# Patient Record
Sex: Male | Born: 1965 | Race: White | Hispanic: No | Marital: Single
Health system: Southern US, Community
[De-identification: ages and names within clinical notes are randomized; demographics above are authoritative.]

---

## 2008-10-26 ENCOUNTER — Emergency Department (HOSPITAL_COMMUNITY): Admission: EM | Admit: 2008-10-26 | Discharge: 2008-10-26 | Payer: Self-pay | Admitting: Emergency Medicine

## 2010-08-31 IMAGING — CR DG SHOULDER 2+V*L*
3 series · 3 of 3 positions shown · non-contrast
Comparison: None

CLINICAL DATA: Fell skating

LEFT SHOULDER - 2+ VIEW

[w shoulder ap internal left]
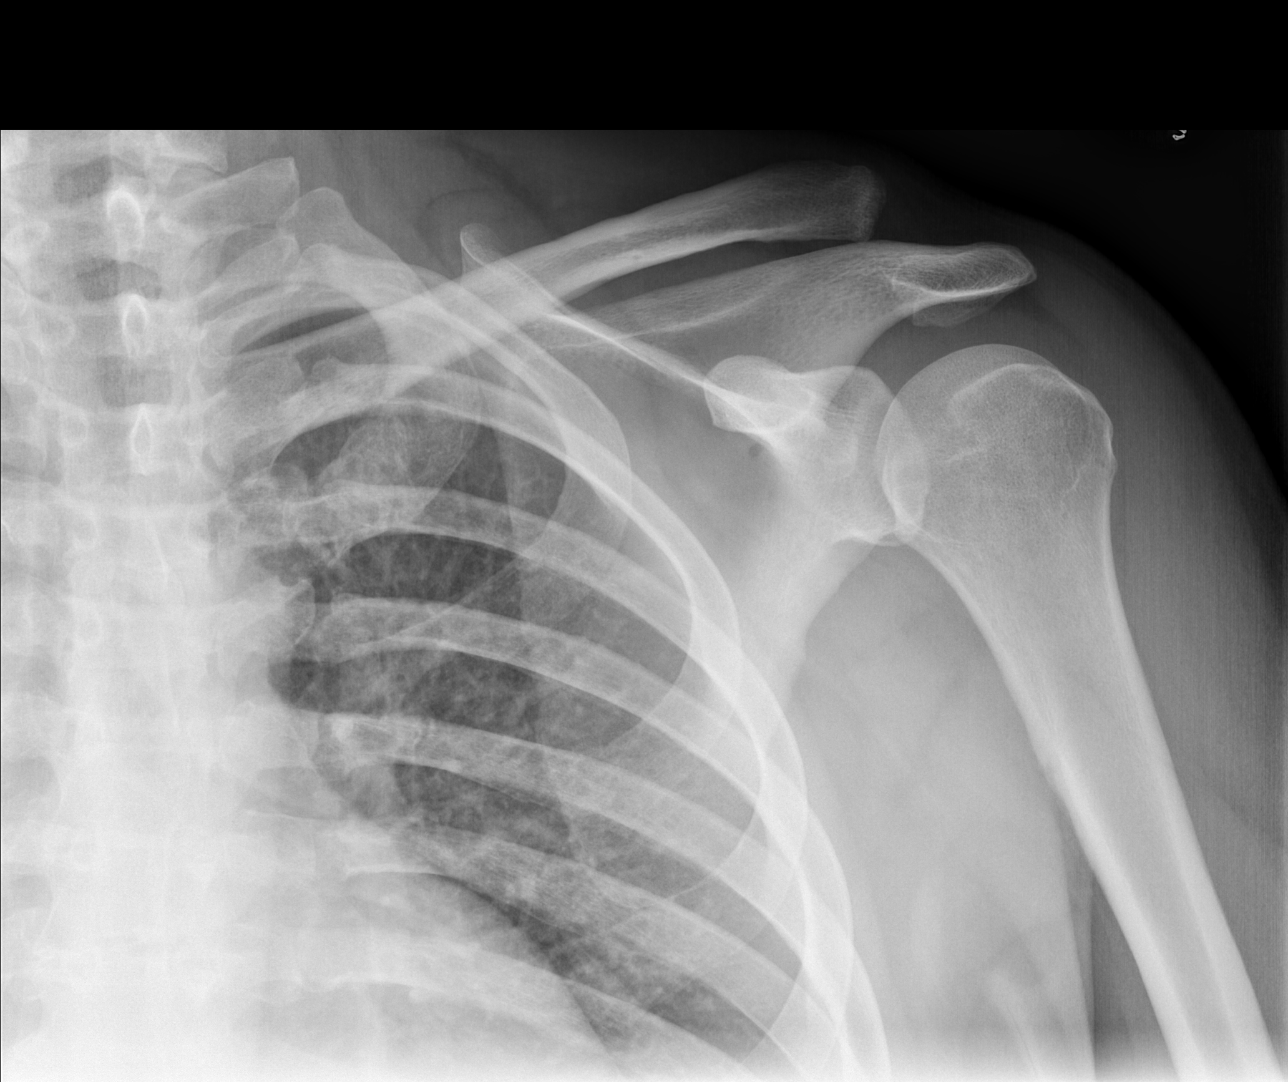

[w shoulder ap external left]
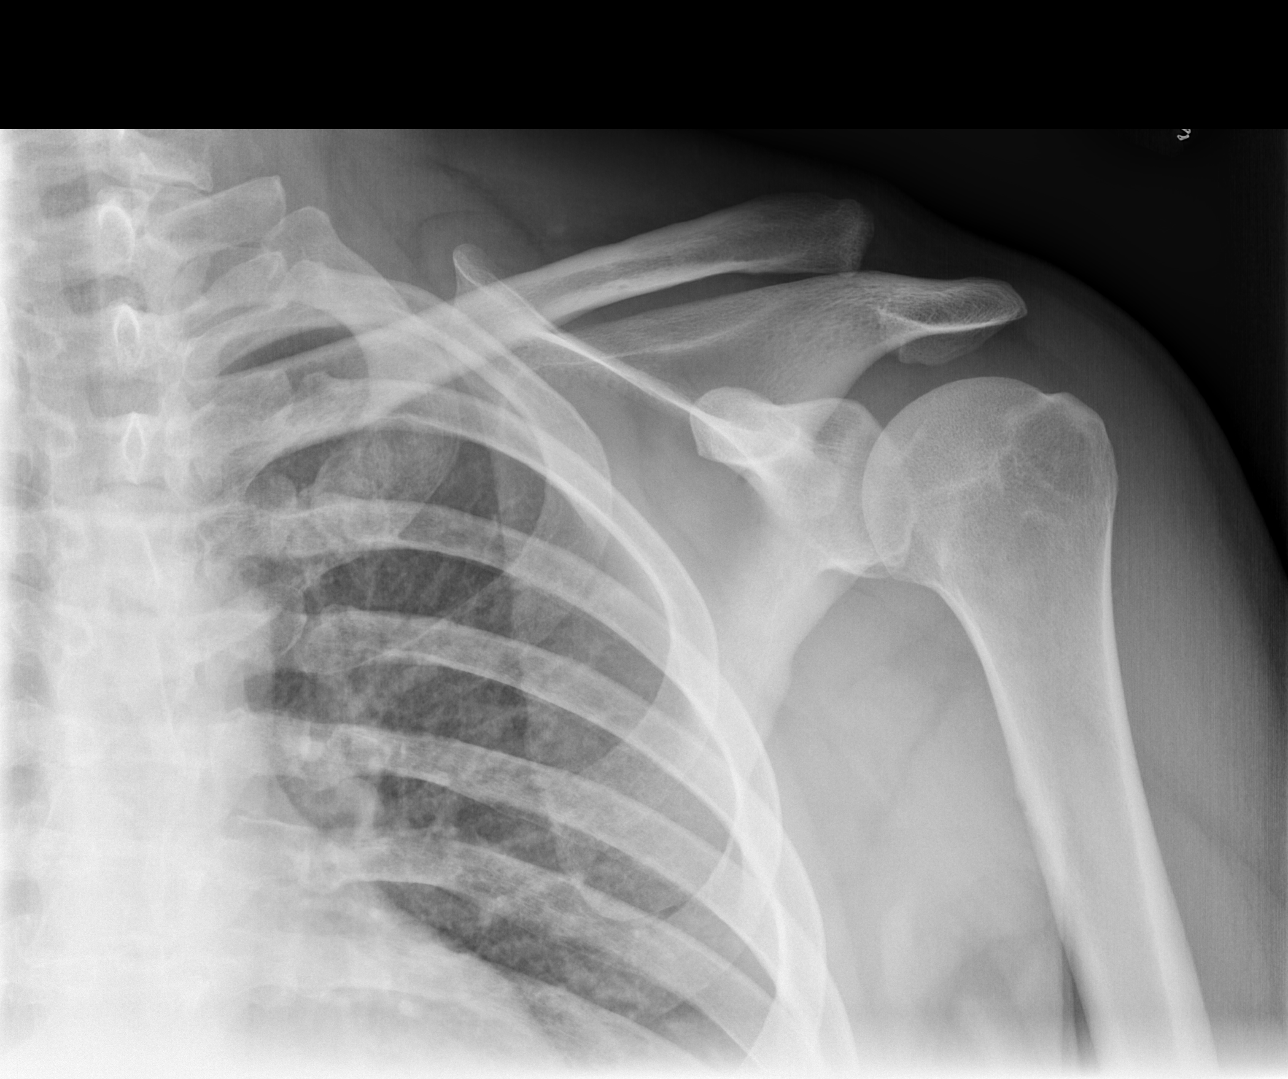

[w shoulder y view left]
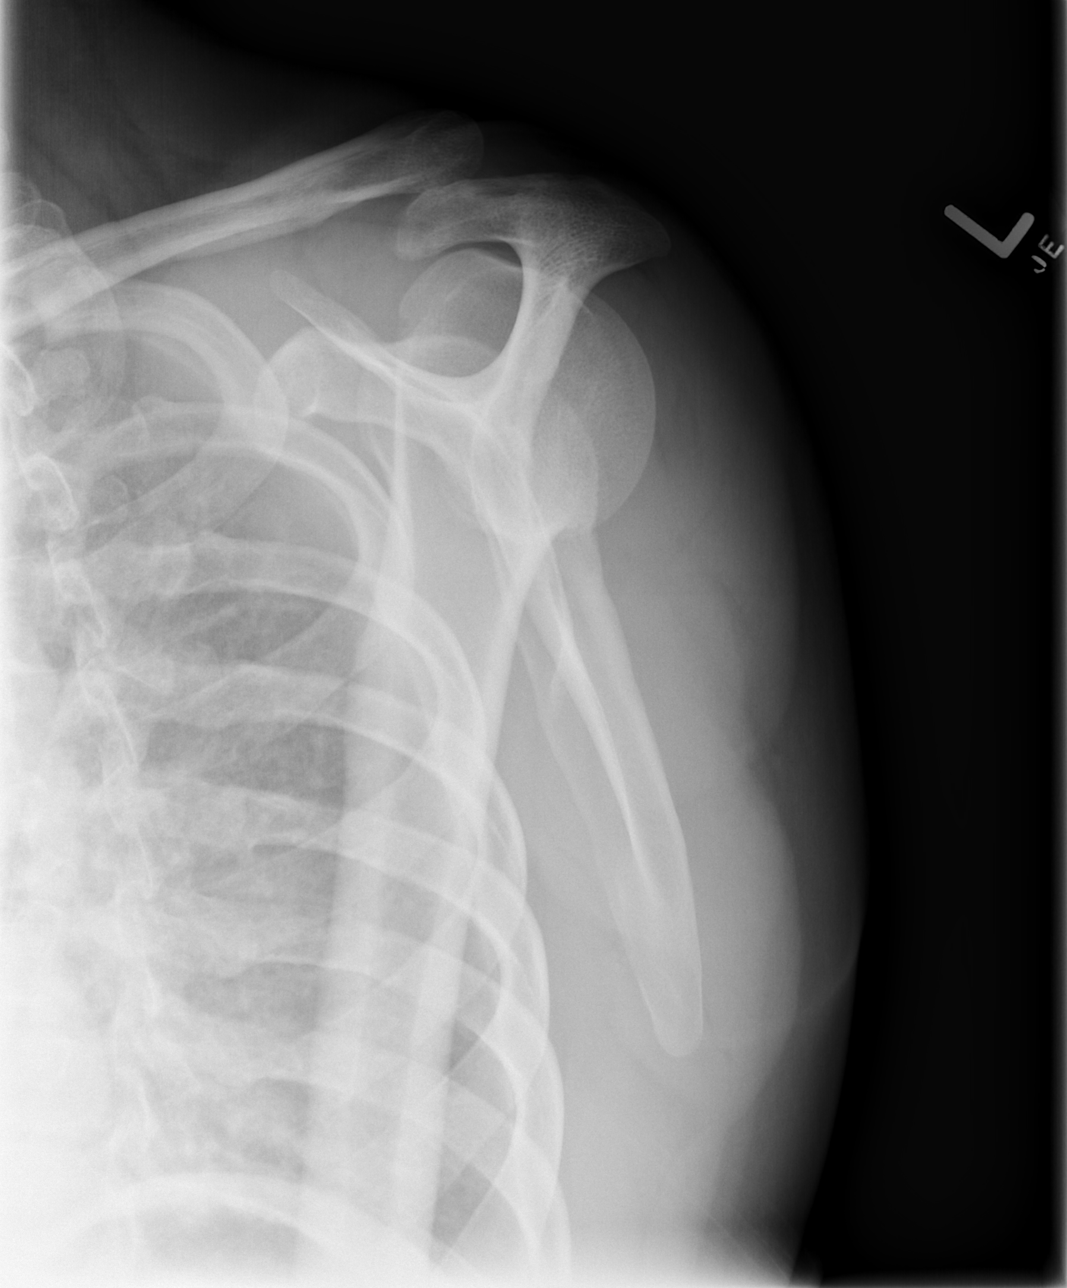

[3 of 3 positions shown; findings below may reference images not displayed]

FINDINGS: There is separation of the acromioclavicular joint by
approximately one bone width of the clavicle.  Clavicle is elevated
26 mm above the coracoid process.  No evidence of shoulder
dislocation or fracture.
IMPRESSION: Acromioclavicular separation.

## 2011-05-30 ENCOUNTER — Ambulatory Visit (INDEPENDENT_AMBULATORY_CARE_PROVIDER_SITE_OTHER): Payer: BC Managed Care – PPO | Admitting: Sports Medicine

## 2011-05-30 VITALS — BP 128/80 | Ht 67.0 in | Wt 200.0 lb

## 2011-05-30 DIAGNOSIS — S838X9A Sprain of other specified parts of unspecified knee, initial encounter: Secondary | ICD-10-CM

## 2011-05-30 DIAGNOSIS — S86119A Strain of other muscle(s) and tendon(s) of posterior muscle group at lower leg level, unspecified leg, initial encounter: Secondary | ICD-10-CM | POA: Insufficient documentation

## 2011-05-30 MED ORDER — NITROGLYCERIN 0.2 MG/HR TD PT24: MEDICATED_PATCH | TRANSDERMAL | Status: AC

## 2011-05-30 MED ORDER — TRAMADOL HCL 50 MG PO TABS: 50.0000 mg | ORAL_TABLET | Freq: Four times a day (QID) | ORAL | Status: AC | PRN

## 2011-05-30 NOTE — Patient Instructions (Signed)
Calf tear. Calf sleeve. Ice 20 mins 3-4x a day. Tramadol. Rehab. Heel lift. Come back to see [place] in 2 weeks.

## 2011-05-30 NOTE — Assessment & Plan Note (Signed)
Body helix calf sleeve. Heel lift by 16th of an inch. ice 20 minutes 3 times a day. Nitroglycerin Home exercise program. Return to clinic in 3 weeks.

## 2011-05-30 NOTE — Progress Notes (Signed)
  Subjective:    Patient ID: Johnny Mejia, male    DOB: [birth year], 46 y.o.   MRN: [id number]  HPI Aser comes in as a new patient to the [facility], however I saw him at the [facility] urgent [facility] a week ago. At that point he had taken a misstep, felt a pop in his left calf, and developed immediate swelling and pain. I diagnosed him with a gastrocnemius musculotendinous junction tear, placed Ace wrap compression, meloxicam and have him follow up here. Overall he is in crutches, and has not significantly better. He is noted increased swelling, as well as some bruising. He is able to actively dorsiflex however develops significant pain with any kind of toe off maneuver. Denies any numbness, or tingling.  Past medical history, surgical history, family history, social history, allergies, and medications reviewed from the medical record and no changes needed. Review of Systems    No fevers, chills, night sweats, weight loss, chest pain, or shortness of breath.  Social History: Non-smoker. Objective:   Physical Exam General:  Well developed, well nourished, and in no acute distress. Neuro:  Alert and oriented x3, extra-ocular muscles intact. Skin: Warm and dry, no rashes noted. Respiratory:  Not using accessory muscles, speaking in full sentences. Musculoskeletal: Knee: Tender to palpation along the medial head of the gastrocnemius at the musculotendinous junction. Negative Thompson's test. Neurovascularly intact distally. Minimal bruising noted.  [facility] ultrasound: Large tear noted along the medial head of the gastrocnemius at the musculotendinous junction. Images saved.     Assessment & Plan:

## 2011-06-12 ENCOUNTER — Ambulatory Visit: Payer: BC Managed Care – PPO | Admitting: Sports Medicine

## 2011-06-19 ENCOUNTER — Ambulatory Visit (INDEPENDENT_AMBULATORY_CARE_PROVIDER_SITE_OTHER): Payer: BC Managed Care – PPO | Admitting: Sports Medicine

## 2011-06-19 VITALS — BP 124/80

## 2011-06-19 DIAGNOSIS — S838X9A Sprain of other specified parts of unspecified knee, initial encounter: Secondary | ICD-10-CM

## 2011-06-19 DIAGNOSIS — S86119A Strain of other muscle(s) and tendon(s) of posterior muscle group at lower leg level, unspecified leg, initial encounter: Secondary | ICD-10-CM

## 2011-06-19 NOTE — Progress Notes (Signed)
  Subjective:    Patient ID: Johnny Mejia, male    DOB: [birth year], 46 y.o.   MRN: [id number]  HPI Juanjose comes in for followup of a left calf injury sustained approximately 3 weeks ago. He had a misstep, and ended up having a small tear in his gastrocnemius musculotendinous junction. I have had a calf sleeve, heel lift, topical nitroglycerin patches. Overall he is approximately 50% better and is able to put weight on the foot now, however he has stopped the nitroglycerin patches. He does continue to do his home rehabilitation exercises. He currently ambulatory with a cane.   Review of Systems    No fevers, chills, night sweats, weight loss, chest pain, or shortness of breath.  Social History: Non-smoker. Objective:   Physical Exam General:  Well developed, well nourished, and in no acute distress. Neuro:  Alert and oriented x3, extra-ocular muscles intact. Skin: Warm and dry, no rashes noted. Respiratory:  Not using accessory muscles, speaking in full sentences. Musculoskeletal: Tender to palpation musculotendinous junction of his left medial head of the gastroc. He has excellent strength to plantar flexion. He was angulated and with the cane ipsilateral to the injured side, he was instructed on how to correctly to walk with a cane on the contralateral side.     Assessment & Plan:

## 2011-06-19 NOTE — Assessment & Plan Note (Signed)
Overall improved. Stopped NTG and orals. Cont cane, heel lift, rehab exercises, calf sleeve. RTC 1 month to reassess.

## 2011-07-17 ENCOUNTER — Ambulatory Visit (INDEPENDENT_AMBULATORY_CARE_PROVIDER_SITE_OTHER): Payer: BC Managed Care – PPO | Admitting: Sports Medicine

## 2011-07-17 DIAGNOSIS — S86119A Strain of other muscle(s) and tendon(s) of posterior muscle group at lower leg level, unspecified leg, initial encounter: Secondary | ICD-10-CM

## 2011-07-17 DIAGNOSIS — S838X9A Sprain of other specified parts of unspecified knee, initial encounter: Secondary | ICD-10-CM

## 2011-07-17 NOTE — Assessment & Plan Note (Signed)
Resolved. RTC prn. Released to full duty.

## 2011-07-17 NOTE — Progress Notes (Signed)
  Subjective:    Patient ID: Johnny Mejia, male    DOB: 08-29-1965, 46 y.o.   MRN: [id number]  HPI Bodin comes in for followup of his left gastrocnemius tear. He is currently 100% better, and ready to go back to work.   Review of Systems    No fevers, chills, night sweats, weight loss, chest pain, or shortness of breath.  Social History: Non-smoker. Objective:   Physical Exam General:  Well developed, well nourished, and in no acute distress. Neuro:  Alert and oriented x3, extra-ocular muscles intact. Skin: Warm and dry, no rashes noted. Respiratory:  Not using accessory muscles, speaking in full sentences. Musculoskeletal:entire calf nontender, full strength to plantar flexion, ambulateswith a nonantalgic gait.    Assessment & Plan:

## 2015-11-18 ENCOUNTER — Emergency Department (HOSPITAL_COMMUNITY)
Admission: EM | Admit: 2015-11-18 | Discharge: 2015-11-18 | Disposition: A | Discharge status: Home / Self Care | Payer: Self-pay

## 2015-11-18 NOTE — ED Notes (Signed)
Pt leaving from registration, prior to triage, angry about wait, states, "It's not life threatening, its a groin muscle, I could just see a doctor in the morning, see I'm smiling", out with s.o./family, slow steady gait.  Pt alert, NAD, calm, interactive, resps e/u, speaking in clear complete sentences, no dyspnea noted.
